# Patient Record
Sex: Male | Born: 1990 | Race: White | Hispanic: No | Marital: Single | Smoking: Never smoker
Health system: Southern US, Community
[De-identification: ages and names within clinical notes are randomized; demographics above are authoritative.]

---

## 2014-03-08 ENCOUNTER — Emergency Department (HOSPITAL_BASED_OUTPATIENT_CLINIC_OR_DEPARTMENT_OTHER)
Admission: EM | Admit: 2014-03-08 | Discharge: 2014-03-08 | Disposition: A | Discharge status: Home / Self Care | Payer: PRIVATE HEALTH INSURANCE | Attending: Emergency Medicine | Admitting: Emergency Medicine

## 2014-03-08 ENCOUNTER — Encounter (HOSPITAL_BASED_OUTPATIENT_CLINIC_OR_DEPARTMENT_OTHER): Payer: Self-pay | Admitting: *Deleted

## 2014-03-08 ENCOUNTER — Emergency Department (HOSPITAL_BASED_OUTPATIENT_CLINIC_OR_DEPARTMENT_OTHER): Payer: PRIVATE HEALTH INSURANCE

## 2014-03-08 DIAGNOSIS — R109 Unspecified abdominal pain: Secondary | ICD-10-CM | POA: Diagnosis present

## 2014-03-08 DIAGNOSIS — R1033 Periumbilical pain: Secondary | ICD-10-CM | POA: Insufficient documentation

## 2014-03-08 LAB — CBC WITH DIFFERENTIAL/PLATELET
Basophils Absolute: 0 10*3/uL (ref 0.0–0.1)
Basophils Relative: 0 % (ref 0–1)
Eosinophils Absolute: 0.1 10*3/uL (ref 0.0–0.7)
Eosinophils Relative: 1 % (ref 0–5)
HCT: 42.8 % (ref 39.0–52.0)
Hemoglobin: 14.5 g/dL (ref 13.0–17.0)
LYMPHS ABS: 2.4 10*3/uL (ref 0.7–4.0)
LYMPHS PCT: 44 % (ref 12–46)
MCH: 27.9 pg (ref 26.0–34.0)
MCHC: 33.9 g/dL (ref 30.0–36.0)
MCV: 82.5 fL (ref 78.0–100.0)
MONO ABS: 0.5 10*3/uL (ref 0.1–1.0)
Monocytes Relative: 9 % (ref 3–12)
NEUTROS PCT: 46 % (ref 43–77)
Neutro Abs: 2.5 10*3/uL (ref 1.7–7.7)
Platelets: 232 10*3/uL (ref 150–400)
RBC: 5.19 MIL/uL (ref 4.22–5.81)
RDW: 12.9 % (ref 11.5–15.5)
WBC: 5.5 10*3/uL (ref 4.0–10.5)

## 2014-03-08 LAB — BASIC METABOLIC PANEL
ANION GAP: 6 (ref 5–15)
BUN: 12 mg/dL (ref 6–23)
CALCIUM: 9.5 mg/dL (ref 8.4–10.5)
CO2: 27 mmol/L (ref 19–32)
Chloride: 104 mEq/L (ref 96–112)
Creatinine, Ser: 0.9 mg/dL (ref 0.50–1.35)
GFR calc Af Amer: >90 mL/min (ref >90)
GFR calc non Af Amer: >90 mL/min (ref >90)
GLUCOSE: 91 mg/dL (ref 70–99)
Potassium: 3.9 mmol/L (ref 3.5–5.1)
SODIUM: 137 mmol/L (ref 135–145)

## 2014-03-08 LAB — URINALYSIS, ROUTINE W REFLEX MICROSCOPIC
BILIRUBIN URINE: NEGATIVE
Glucose, UA: NEGATIVE mg/dL
Hgb urine dipstick: NEGATIVE
Ketones, ur: NEGATIVE mg/dL
Leukocytes, UA: NEGATIVE
Nitrite: NEGATIVE
PH: 6.5 (ref 5.0–8.0)
Protein, ur: NEGATIVE mg/dL
Specific Gravity, Urine: 1.01 (ref 1.005–1.030)
Urobilinogen, UA: 0.2 mg/dL (ref 0.0–1.0)

## 2014-03-08 MED ORDER — IOHEXOL 300 MG/ML  SOLN
100.0000 mL | Freq: Once | INTRAMUSCULAR | Status: AC | PRN
  Administered 2014-03-08: 100 mL via INTRAVENOUS

## 2014-03-08 MED ORDER — IOHEXOL 300 MG/ML  SOLN
25.0000 mL | Freq: Once | INTRAMUSCULAR | Status: AC | PRN
  Administered 2014-03-08: 25 mL via ORAL

## 2014-03-08 NOTE — ED Provider Notes (Signed)
CSN: [id number]     Arrival date & time 03/08/14  1307 History   First MD Initiated Contact with Patient 03/08/14 1446     Chief Complaint  Patient presents with  . Abdominal Pain     (Consider location/radiation/quality/duration/timing/severity/associated sxs/prior Treatment) HPI Comments: Pt comes in with periumbilical pain times one week. Pt states that he read on the Internet that he could have a hernia and when the pain wouldn't stop he thought he needed to be seen. No fever, n/v/d.pt thinks that he has a umbilical hernia but has not seen any protrusion.   The history is provided by the patient. No language interpreter was used.    History reviewed. No pertinent past medical history. History reviewed. No pertinent past surgical history. No family history on file. History  Substance Use Topics  . Smoking status: Never Smoker   . Smokeless tobacco: Not on file  . Alcohol Use: No    Review of Systems  All other systems reviewed and are negative.     Allergies  Review of patient's allergies indicates no known allergies.  Home Medications   Prior to Admission medications   Not on File   BP 112/57 mmHg  Pulse 80  Temp(Src) 97.9 F (36.6 C) (Oral)  Resp 20  Ht 6' (1.829 m)  Wt 160 lb (72.576 kg)  BMI 21.70 kg/m2  SpO2 100% Physical Exam  Constitutional: He is oriented to person, place, and time. He appears well-developed and well-nourished.  Cardiovascular: Normal rate and regular rhythm.   Pulmonary/Chest: Effort normal and breath sounds normal.  Abdominal: Soft. Bowel sounds are normal. There is tenderness in the periumbilical area. There is no CVA tenderness.  Neurological: He is alert and oriented to person, place, and time. Coordination normal.  Skin: Skin is warm and dry.  Nursing note and vitals reviewed.   ED Course  Procedures (including critical care time) Labs Review Labs Reviewed  URINALYSIS, ROUTINE W REFLEX MICROSCOPIC  CBC WITH DIFFERENTIAL   BASIC METABOLIC PANEL    Imaging Review Ct Abdomen Pelvis W Contrast  03/08/2014   CLINICAL DATA:  Left periumbilical pain on off for 1 month worse over the past 4 days.  EXAM: CT ABDOMEN AND PELVIS WITH CONTRAST  TECHNIQUE: Multidetector CT imaging of the abdomen and pelvis was performed using the standard protocol following bolus administration of intravenous contrast.  CONTRAST:  25mL OMNIPAQUE IOHEXOL 300 MG/ML SOLN, 100mL OMNIPAQUE IOHEXOL 300 MG/ML SOLN  COMPARISON:  None.  FINDINGS: Lung bases are within normal.  Abdominal images demonstrate a normal liver, spleen, pancreas, gallbladder and adrenal glands. Kidneys normal in size, shape and position without evidence of focal mass or nephrolithiasis. There is mild symmetric prominence of the intrarenal collecting systems. Ureters are within normal. The appendix is normal. Abdominal aorta is normal. There is no free fluid or inflammatory change. Large and small bowel are unremarkable.  Pelvic images demonstrate the bladder, prostate and rectosigmoid colon to the within normal. Remaining bones and soft tissues are unremarkable.  IMPRESSION: No acute findings in the abdomen/pelvis.   Electronically Signed   By: Elberta Fortisaniel  Boyle M.D.   On: 03/08/2014 15:57     EKG Interpretation None      MDM   Final diagnoses:  Abdominal pain   No acute process noted. Discussed follow up and return precautions with pt    Teressa LowerVrinda Verbie Babic, NP 03/08/14 1632  Merrie RoofJohn David Wofford III, MD 03/11/14 [phone]

## 2014-03-08 NOTE — Discharge Instructions (Signed)

## 2014-03-08 NOTE — ED Notes (Signed)
Abdominal pain for a week. He thought it was muscle pain from working out. He has had the pain on and off for a month but worse this past week.

## 2016-08-08 IMAGING — CT CT ABD-PELV W/ CM
2 of 5 series · 16 of 46 positions shown, 18 images · IV contrast (APPLIED)
Comparison: None.

CLINICAL DATA: Left periumbilical pain on off for 1 month worse
over the past 4 days.

EXAM:
CT ABDOMEN AND PELVIS WITH CONTRAST
TECHNIQUE: Multidetector CT imaging of the abdomen and pelvis was performed
using the standard protocol following bolus administration of
intravenous contrast.
CONTRAST:  25mL OMNIPAQUE IOHEXOL 300 MG/ML SOLN, 100mL OMNIPAQUE
IOHEXOL 300 MG/ML SOLN

[Series 2: abd/pelvis 5.0 b31f · axial · 0.67mm/px · z∈[-536,-161]mm · 13 of 85 slices shown, 15 images]
[im 5/85  soft-tissue]
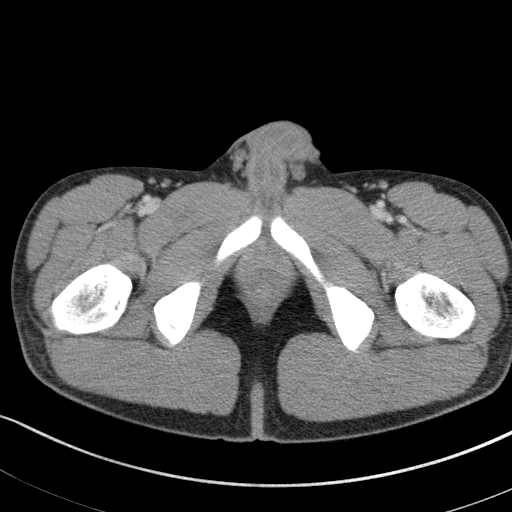
[im 5/85  bone]
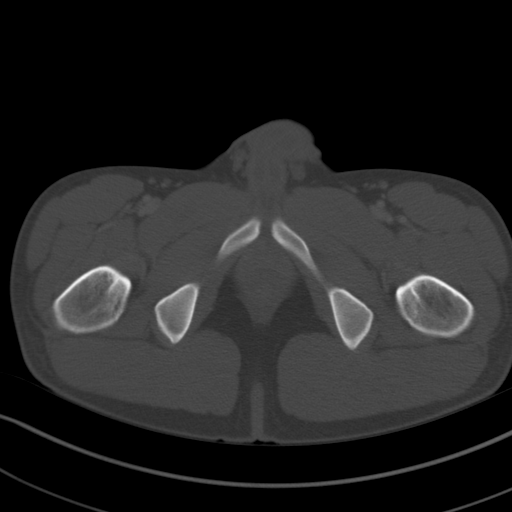
[im 10/85  soft-tissue]
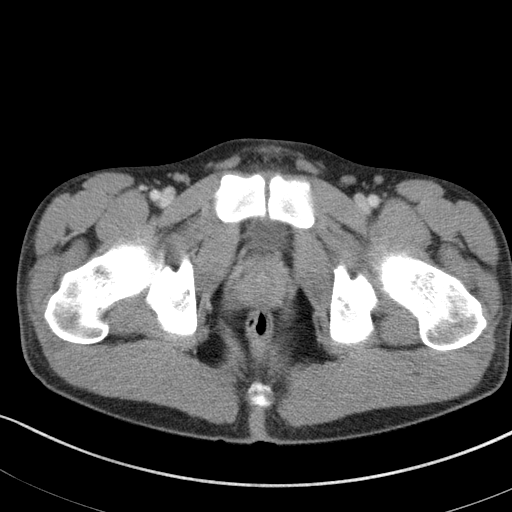
[im 19/85  soft-tissue]
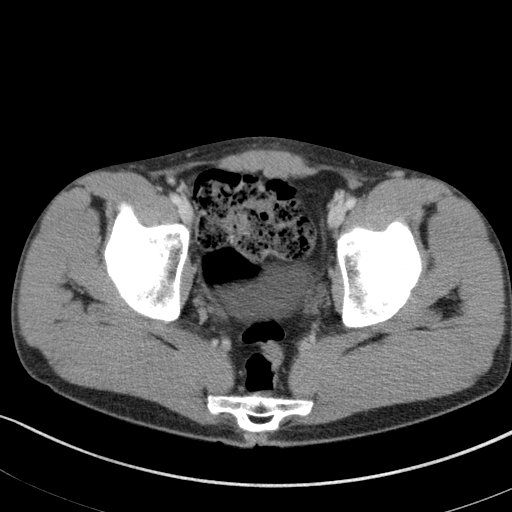
[im 24/85  soft-tissue]
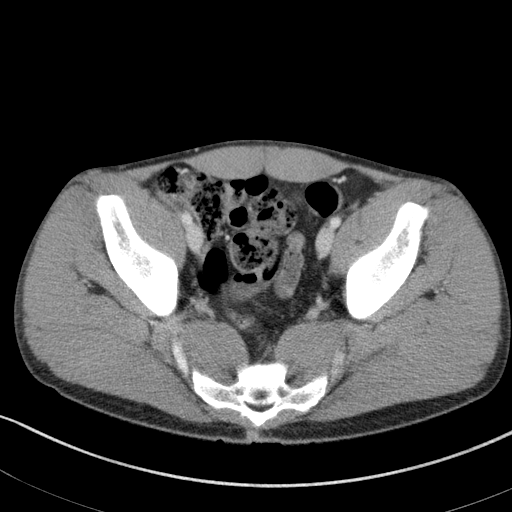
[im 29/85  soft-tissue]
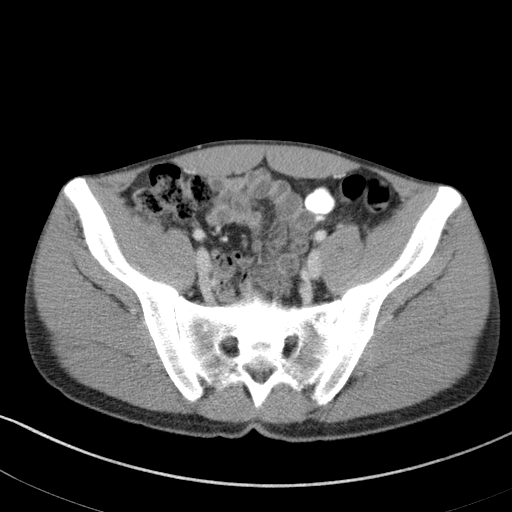
[im 38/85  soft-tissue]
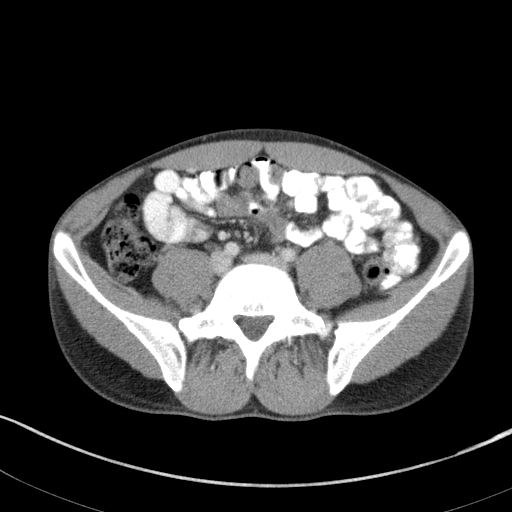
[im 43/85  soft-tissue]
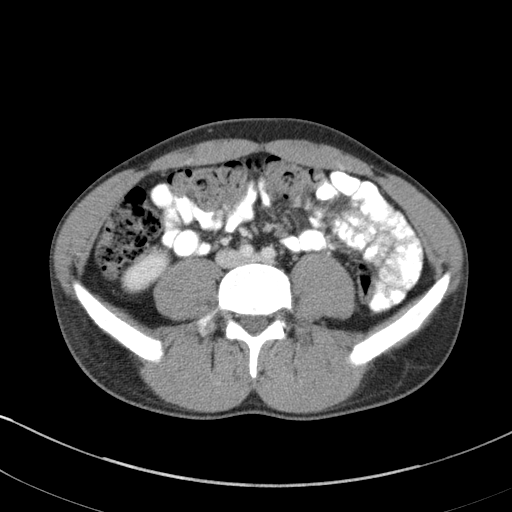
[im 47/85  soft-tissue]
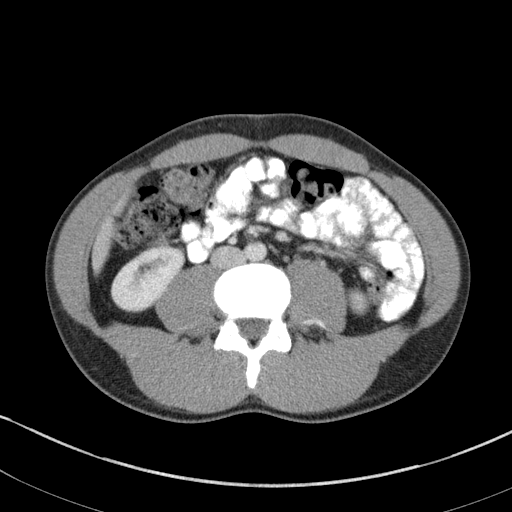
[im 57/85  soft-tissue]
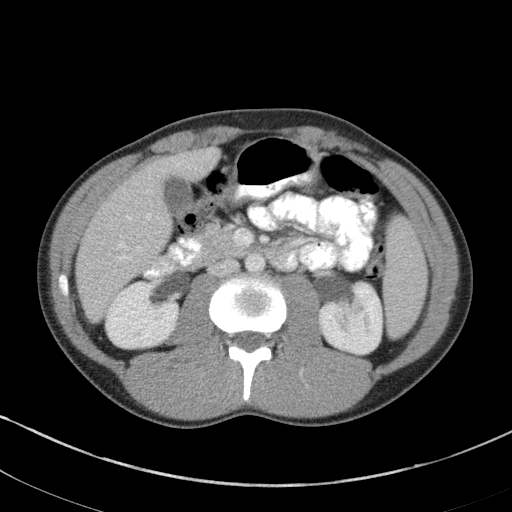
[im 57/85  bone]
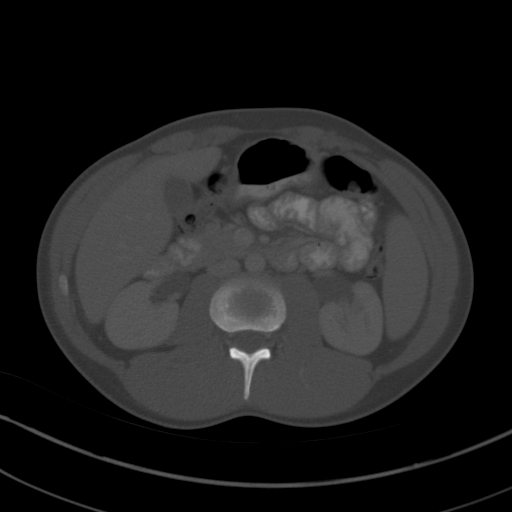
[im 61/85  soft-tissue]
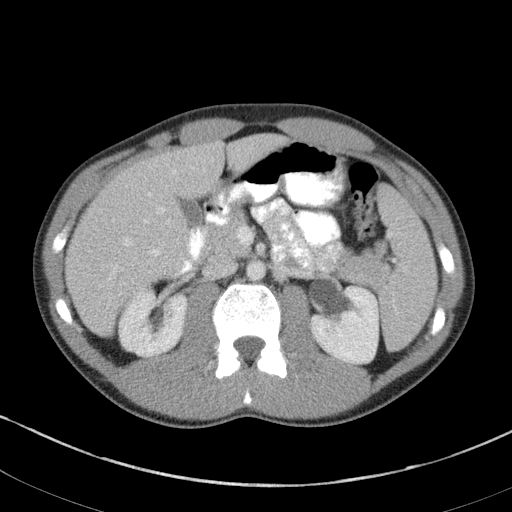
[im 66/85  soft-tissue]
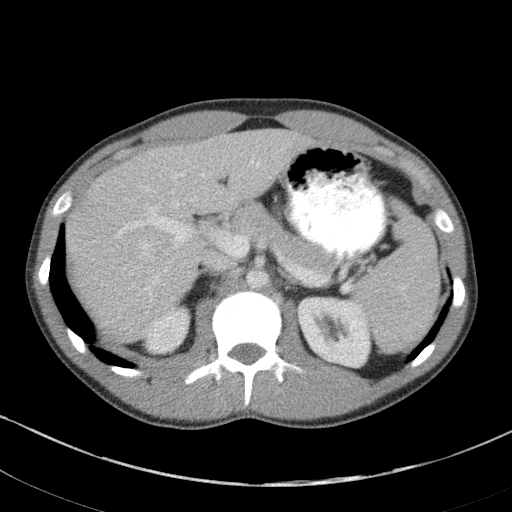
[im 75/85  soft-tissue]
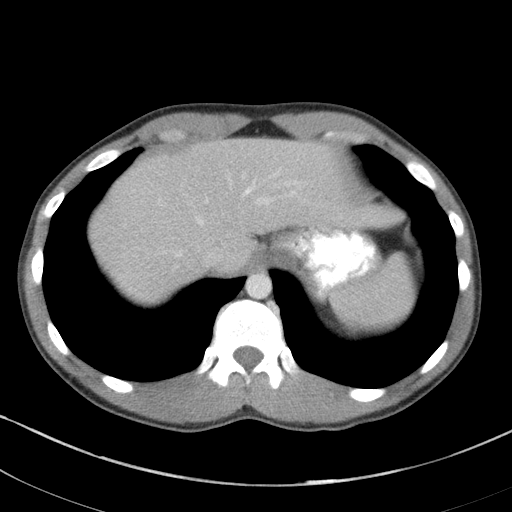
[im 80/85  soft-tissue]
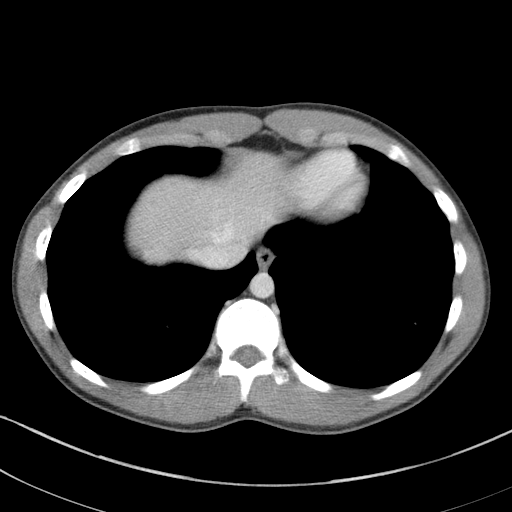

[Series 5: abd/pelvis 3.0 coronal · coronal · 0.72mm/px · 3 of 75 slices shown]
[im 25/75  soft-tissue]
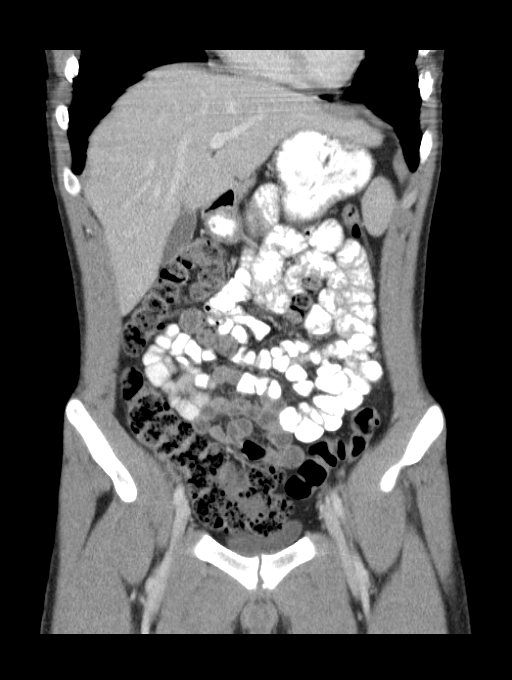
[im 33/75  soft-tissue]
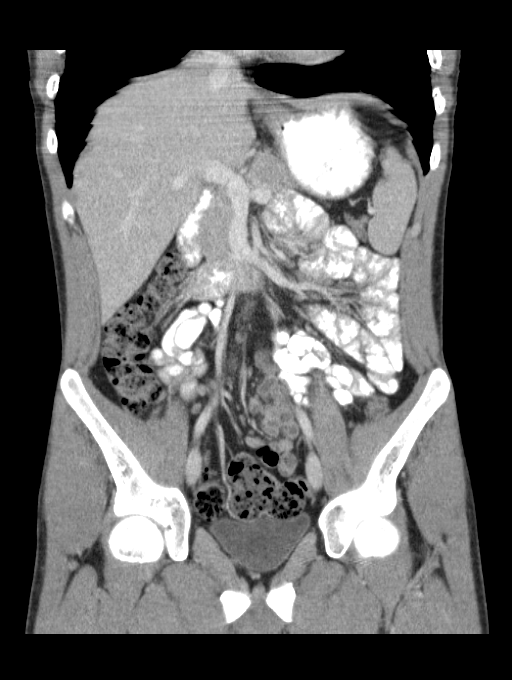
[im 42/75  soft-tissue]
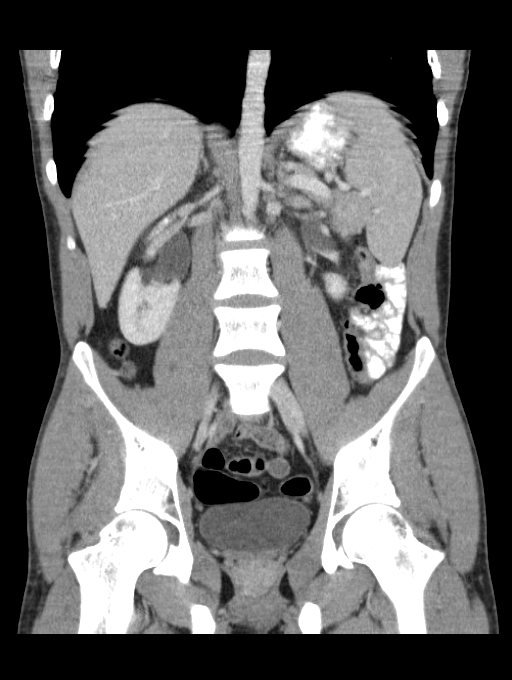

[16 of 46 positions shown; findings below may reference images not displayed]

FINDINGS: Lung bases are within normal.

Abdominal images demonstrate a normal liver, spleen, pancreas,
gallbladder and adrenal glands. Kidneys normal in size, shape and
position without evidence of focal mass or nephrolithiasis. There is
mild symmetric prominence of the intrarenal collecting systems.
Ureters are within normal. The appendix is normal. Abdominal aorta
is normal. There is no free fluid or inflammatory change. Large and
small bowel are unremarkable.

Pelvic images demonstrate the bladder, prostate and rectosigmoid
colon to the within normal. Remaining bones and soft tissues are
unremarkable.
IMPRESSION: No acute findings in the abdomen/pelvis.
